# Patient Record
Sex: Female | Born: 2000 | Race: Black or African American | Hispanic: No | Marital: Single | Smoking: Never smoker
Health system: Southern US, Community
[De-identification: ages and names within clinical notes are randomized; demographics above are authoritative.]

## PROBLEM LIST (undated history)

## (undated) DIAGNOSIS — R569 Unspecified convulsions: Secondary | ICD-10-CM

---
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
## 2020-01-15 ENCOUNTER — Other Ambulatory Visit: Payer: Self-pay

## 2020-01-15 ENCOUNTER — Encounter (INDEPENDENT_AMBULATORY_CARE_PROVIDER_SITE_OTHER): Payer: Self-pay | Admitting: Pediatrics

## 2020-01-15 ENCOUNTER — Ambulatory Visit (INDEPENDENT_AMBULATORY_CARE_PROVIDER_SITE_OTHER): Payer: Managed Care, Other (non HMO) | Admitting: Pediatrics

## 2020-01-15 VITALS — BP 116/68 | HR 68 | Ht 61.0 in | Wt 175.4 lb

## 2020-01-15 DIAGNOSIS — G40209 Localization-related (focal) (partial) symptomatic epilepsy and epileptic syndromes with complex partial seizures, not intractable, without status epilepticus: Secondary | ICD-10-CM | POA: Diagnosis not present

## 2020-01-15 DIAGNOSIS — F445 Conversion disorder with seizures or convulsions: Secondary | ICD-10-CM | POA: Diagnosis not present

## 2020-01-15 NOTE — Patient Instructions (Signed)
I am pleased that you are doing well.  I would like to see you in 6 months.  You should have another medication to make that next appointment.  I would like to see you when you are home for Christmas break.  We talked about my decision to keep you on antiepileptic medication.  I think it is the right one to do.  Please let me know if I can be of help between now and when you are scheduled to be seen again.

## 2020-01-15 NOTE — Progress Notes (Signed)
Patient: Christina Robinson MRN: [id number] Sex: female DOB: [birth year]  Provider: Ellison Carwin, MD Location of Care: [facility]  Note type: Routine return visit  History of Present Illness: Referral Source: Christina Moore MD History from: patient and [facility] chart Chief Complaint: Seizures  Christina Robinson is a 19 y.o. female who was evaluated January 15, 2020 for the first time since July 17, 2019.  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  Her past history is recorded my last office note.  Her last known event was May 16, 2018.  She has been taken off oxcarbazepine on several occasions and every time it was stopped, she has recurrent events.  Even though I believe that it is serving the role of placebo, the implications of having events would severely curtail her dependence including driving.  The benefit of taking her off the medication that is not necessarily preventing these events is thoroughly outweighed by the clinical reality of that each time this has been done, she has an event that has led to restarting the medication.  She contracted Covid at school in September 2020 with a high fever and swelling of her throat.  This was not diagnosed until her third PCR test.  She was in quarantine for 2 weeks and then returned to work.  It is not clear whether she contracted this in her living group or at work.  She was vaccinated earlier this year and it went well.  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  She is not taking any summer courses.  She works 3 days a week at [employer] and lives at home.  She will return to school in person in mid-August.  She did not enjoy virtual classes.  Review of Systems: A complete review of systems was remarkable for patient is here to be seen  for sizures. Patient reports that she has not had any seizures since her last visit. She states that she has been doing well. She states that she has no concerns at this time., all other systems reviewed and negative.  Past Medical History Diagnosis Date  . Seizures (HCC)    Hospitalizations: No., Head Injury: No., Nervous System Infections: No., Immunizations up to date: Yes.    Copied from prior chart See November 17, 2017 note for details concerning her seizure-like behavior.  Seizure activity began at 19 years of age. The patient had normal development up until that time. Seizures were associated with tingling sensation in the right arm lasting 5 to 10 seconds, followed by tightening of the right fist, flexion of the wrist and extension of the arm, her head turning to the right, neck extending, and eyes deviated to the right and then rolling to the other side. EEG showed central spikes. She had Todd paresis. The patient had a cluster of seizures, November 23, 2011, and was transferred from [facility] to [place].   EEG monitoring showed no ictal activity during clinical behaviors that had been termed seizures which involved shaking of her lower extremities, shaking of her upper extremities without eye deviation, and presumed loss of consciousness.  A video made of these behaviors at home by her parents show fidgety, non-specific body movements, with her eyes shut that usually would begin when her parents would try to awaken her.  Dr. Theora Robinson  at [facility] a 42 minute EEG in the awake state and sleep that was entirely normal on May 20, 2014.  She has been taken on and off Trileptal based on perception that episodes are non-epileptic and then placed back on medication often through emergency department interactions. See August 16, 2017 note for further details.  Birth History 6lbs. 9oz. infant born at [redacted]weeks gestational age to a 19year old g  1p 29female. Gestation wasuncomplicated Mother receivedno Medication Normalspontaneous vaginal delivery Nursery Course wasuncomplicated Growth and Development wasrecalled asnormal  Behavior History psychogenic nonepileptic seizures  Surgical History History reviewed. No pertinent surgical history.  Family History family history is not on file. Family history is negative for migraines, seizures, intellectual disabilities, blindness, deafness, birth defects, chromosomal disorder, or autism.  Social History Socioeconomic History  . Marital status: Single  . Years of education:  29  . Highest education level:  Freshman year in college  Occupational History  .  Employed in [place] at [employer] and [employer]  Tobacco Use  . Smoking status: Passive Smoke Exposure - Never Smoker  . Smokeless tobacco: Never Used  Substance and Sexual Activity  . Alcohol use: No  . Drug use: Not on file  . 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    She attended [employer].    She lives with both parents. She has two sisters.    She enjoys music, Softball, and being in her room.    She is a Electrical engineer at [employer].   No Known Allergies  Physical Exam BP 116/68   Pulse 68   Ht 5\' 1"  (1.549 m)   Wt 175 lb 6.4 oz (79.6 kg)   BMI 33.14 kg/m   General: alert, well developed, obese, in no acute distress, black hair, brown eyes, right handed Head: normocephalic, no dysmorphic features Ears, Nose and Throat: Otoscopic: tympanic membranes normal; pharynx: oropharynx is pink without exudates or tonsillar hypertrophy Neck: supple, full range of motion, no cranial or cervical bruits Respiratory: auscultation clear Cardiovascular: no murmurs, pulses are normal Musculoskeletal: no skeletal deformities or apparent scoliosis Skin: no rashes or neurocutaneous lesions  Neurologic Exam  Mental Status:  alert; oriented to person, place and year; knowledge is normal for age; language is normal Cranial Nerves: visual fields are full to double simultaneous stimuli; extraocular movements are full and conjugate; pupils are round reactive to light; funduscopic examination shows sharp disc margins with normal vessels; symmetric facial strength; midline tongue and uvula; air conduction is greater than bone conduction bilaterally Motor: Normal strength, tone and mass; good fine motor movements; no pronator drift Sensory: intact responses to cold, vibration, proprioception and stereognosis Coordination: good finger-to-nose, rapid repetitive alternating movements and finger apposition Gait and Station: normal gait and station: patient is able to walk on heels, toes and tandem without difficulty; balance is adequate; Romberg exam is negative; Gower response is negative Reflexes: symmetric and diminished bilaterally; no clonus; bilateral flexor plantar responses  Assessment 1.  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 2.  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  Discussion I am pleased the Peter is doing well physically, neurologically, and academically.  Plan I am not going to change her oxcarbazepine.  I am pleased that she is taking contraceptive medication.  My biggest concern is pregnancy.  She can otherwise be on this medication for the rest of her life if she needs  it.  I do not see any reason to try to take her off of oxcarbazepine until such time as she does not have to drive to get to and from work.  She will return to see me in 6 months time.  I wrote a prescription 6 months ago for a 1 year supply of oxcarbazepine.  Greater than 50% of the 25-minute visit was spent in counseling and coordination of care concerning her nonepileptic seizures, the reason to continue her oxcarbazepine, and the problems that that will present when and if she wants to get  pregnant.  Hopefully that is a long way off.   Medication List   Accurate as of January 15, 2020 11:15 AM. If you have any questions, ask your nurse or doctor.    medroxyPROGESTERone 150 MG/ML injection Commonly known as: DEPO-PROVERA Inject 1 mL into the muscle every 3 (three) months.   oxcarbazepine 600 MG tablet Commonly known as: TRILEPTAL TAKE 2 TABLETS BY MOUTH  TWICE A DAY    The medication list was reviewed and reconciled. All changes or newly prescribed medications were explained.  A complete medication list was provided to the patient/caregiver.  Deetta Perla MD

## 2020-06-28 ENCOUNTER — Other Ambulatory Visit (INDEPENDENT_AMBULATORY_CARE_PROVIDER_SITE_OTHER): Payer: Self-pay | Admitting: Pediatrics

## 2020-06-28 DIAGNOSIS — G40209 Localization-related (focal) (partial) symptomatic epilepsy and epileptic syndromes with complex partial seizures, not intractable, without status epilepticus: Secondary | ICD-10-CM

## 2020-08-06 ENCOUNTER — Ambulatory Visit (INDEPENDENT_AMBULATORY_CARE_PROVIDER_SITE_OTHER): Payer: Managed Care, Other (non HMO) | Admitting: Pediatrics

## 2020-08-07 ENCOUNTER — Encounter (INDEPENDENT_AMBULATORY_CARE_PROVIDER_SITE_OTHER): Payer: Self-pay | Admitting: Pediatrics

## 2020-08-07 ENCOUNTER — Ambulatory Visit (INDEPENDENT_AMBULATORY_CARE_PROVIDER_SITE_OTHER): Payer: Managed Care, Other (non HMO) | Admitting: Pediatrics

## 2020-08-07 ENCOUNTER — Other Ambulatory Visit: Payer: Self-pay

## 2020-08-07 VITALS — BP 122/78 | HR 76 | Ht 61.0 in | Wt 178.0 lb

## 2020-08-07 DIAGNOSIS — G40209 Localization-related (focal) (partial) symptomatic epilepsy and epileptic syndromes with complex partial seizures, not intractable, without status epilepticus: Secondary | ICD-10-CM | POA: Diagnosis not present

## 2020-08-07 DIAGNOSIS — F445 Conversion disorder with seizures or convulsions: Secondary | ICD-10-CM | POA: Diagnosis not present

## 2020-08-07 MED ORDER — OXCARBAZEPINE 600 MG PO TABS: ORAL_TABLET | ORAL | 3 refills | Status: DC

## 2020-08-07 NOTE — Progress Notes (Signed)
Patient: Christina Robinson MRN: [id number] Sex: female DOB: August 17, 2000  Provider: Ellison Carwin, MD Location of Care: [facility]  Note type: Routine return visit  History of Present Illness: Referral Source: Tad Moore, MD History from: patient and [facility] chart Chief Complaint: Seizures  Christina Robinson is a 19 y.o. female who was evaluated August 07, 2020 for the first time since January 15, 2020.  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  Her past history is recorded in the July 17, 2019 note.  Her last known event was May 16, 2018.  Every time she has been taken off of oxcarbazepine she has had recurrent event.  She does not want to come off the medication.  She is a Holiday representative at [employer].  She hopes to work for the [employer].  She contracted Covid in September 2020 with high fever and swelling of her throat.  She has not had long-term symptoms.  She is fully vaccinated now.  She works part-time for [employer] at [employer] where she attends school and when she is at home in [place] she works at [employer].  Classes are going well.  She is getting adequate sleep.  She has gained about 2-1/2 pounds since her last visit.  Her mother feels her oxcarbazepine through [facility].  They asked for a 1 year supply which I will provide.  Review of Systems: A complete review of systems was remarkable for patient is here to be seen for seizures. She reports that she has been doing well since her last visit. She states that she has not had any seizures since her last visit. She has no concerns at this time., all other systems reviewed and negative.  Past Medical History Diagnosis Date  . Seizures (HCC)    Hospitalizations: No., Head Injury: No., Nervous System Infections: No., Immunizations up to date: Yes.    Copied from prior chart  notes See November 17, 2017 note for details concerning her seizure-like behavior.  Seizure activity began at 19 years of age. The patient had normal development up until that time. Seizures were associated with tingling sensation in the right arm lasting 5 to 10 seconds, followed by tightening of the right fist, flexion of the wrist and extension of the arm, her head turning to the right, neck extending, and eyes deviated to the right and then rolling to the other side. EEG showed central spikes. She had Todd paresis. The patient had a cluster of seizures, November 23, 2011, and was transferred from [facility] to [place].   EEG monitoring showed no ictal activity during clinical behaviors that had been termed seizures which involved shaking of her lower extremities, shaking of her upper extremities without eye deviation, and presumed loss of consciousness.  A video made of these behaviors at home by her parents show fidgety,non-specific body movements,with her eyes shut that usually would begin when her parents would try to awaken her.  Dr. Theora Master at [facility] a 42 minute EEG in the awake state and sleep that was entirely normal on May 20, 2014.  She has been taken on and off Trileptal based on perception that episodes are non-epileptic and then placed back on medication often through emergency department interactions. See August 16, 2017 note for further details.  Birth History 6lbs. 9oz. infant born at [redacted]weeks gestational age to a 19year old g 1p 55female. Gestation wasuncomplicated  Mother receivedno Medication Normalspontaneous vaginal delivery Nursery Course wasuncomplicated Growth and Development wasrecalled asnormal  Behavior History psychogenic nonepileptic seizures  Surgical History History reviewed. No pertinent surgical history.  Family History family history is not on file. Family history is negative for  migraines, seizures, intellectual disabilities, blindness, deafness, birth defects, chromosomal disorder, or autism.  Social History Socioeconomic History  . Marital status: Single  . Years of education:  36  . Highest education level:  Primary school teacher  Occupational History  .  [employer] part-time  Tobacco Use  . Smoking status: Passive Smoke Exposure - Never Smoker  . Smokeless tobacco: Never Used  Substance and Sexual Activity  . Alcohol use: No  . Drug use: Not on file  . 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    She attended [employer].    She lives with both parents. She has two sisters.    She enjoys music, Softball, and being in her room.    She will be a Holiday representative at [employer].   No Known Allergies  Physical Exam BP 122/78   Pulse 76   Ht 5\' 1"  (1.549 m)   Wt 178 lb (80.7 kg)   BMI 33.63 kg/m   General: alert, well developed, obese, in no acute distress, black hair, brown eyes, right handed Head: normocephalic, no dysmorphic features Ears, Nose and Throat: Otoscopic: tympanic membranes normal; pharynx: oropharynx is pink without exudates or tonsillar hypertrophy Neck: supple, full range of motion, no cranial or cervical bruits Respiratory: auscultation clear Cardiovascular: no murmurs, pulses are normal Musculoskeletal: no skeletal deformities or apparent scoliosis Skin: no rashes or neurocutaneous lesions  Neurologic Exam  Mental Status: alert; oriented to person, place and year; knowledge is normal for age; language is normal Cranial Nerves: visual fields are full to double simultaneous stimuli; extraocular movements are full and conjugate; pupils are round reactive to light; funduscopic examination shows sharp disc margins with normal vessels; symmetric facial strength; midline tongue and uvula; air conduction is greater than bone conduction bilaterally Motor: Normal strength,  tone and mass; good fine motor movements; no pronator drift Sensory: intact responses to cold, vibration, proprioception and stereognosis Coordination: good finger-to-nose, rapid repetitive alternating movements and finger apposition Gait and Station: normal gait and station: patient is able to walk on heels, toes and tandem without difficulty; balance is adequate; Romberg exam is negative; Gower response is negative Reflexes: symmetric and diminished bilaterally; no clonus; bilateral flexor plantar responses  Assessment 1.  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 2.  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  Discussion I spoke with at length about this.  She does not want to come off medication.  Will be to me times when she is had breakthrough seizures and she depends on her car to get to and from work.  Even if these episodes are all psychogenic, it could be problematic.  Plan I refilled the oxcarbazepine prescription for a year.  She will need to be seen by an adult neurologist after I retire in September 2022.  I want her to return to see me in 6 months' time and hope that I will have identified a provider for her..  50% of a 20-minute visit was spent in counseling and coordination of care concerning her history of seizures, nonepileptic seizures, treatment, and transition of care.   Medication List   Accurate as of August 07, 2020 12:04  PM. If you have any questions, ask your nurse or doctor.    medroxyPROGESTERone 150 MG/ML injection Commonly known as: DEPO-PROVERA Inject 1 mL into the muscle every 3 (three) months.   oxcarbazepine 600 MG tablet Commonly known as: TRILEPTAL TAKE 2 TABLETS BY MOUTH  TWICE DAILY    The medication list was reviewed and reconciled. All changes or newly prescribed medications were explained.  A complete medication list was provided to the patient/caregiver.  Deetta Perla MD

## 2020-08-07 NOTE — Patient Instructions (Signed)
Thank you for coming today. I am glad that you are doing well. I am glad that school is going well. I refilled your prescription for a year through your mail service. I would like to see you in 6 months so that we can arrange for long-term care with an adult neurologist.  In the interim please let me know if there is anything I can do to help you.

## 2020-12-12 ENCOUNTER — Encounter (INDEPENDENT_AMBULATORY_CARE_PROVIDER_SITE_OTHER): Payer: Self-pay

## 2021-01-29 ENCOUNTER — Telehealth (INDEPENDENT_AMBULATORY_CARE_PROVIDER_SITE_OTHER): Payer: Self-pay | Admitting: Pediatrics

## 2021-01-29 ENCOUNTER — Encounter: Payer: Self-pay | Admitting: Neurology

## 2021-01-29 DIAGNOSIS — G40209 Localization-related (focal) (partial) symptomatic epilepsy and epileptic syndromes with complex partial seizures, not intractable, without status epilepticus: Secondary | ICD-10-CM

## 2021-01-29 DIAGNOSIS — F445 Conversion disorder with seizures or convulsions: Secondary | ICD-10-CM

## 2021-01-29 NOTE — Telephone Encounter (Signed)
Requested transfer of care to Dr. Karen Aquino. 

## 2021-02-05 ENCOUNTER — Ambulatory Visit (INDEPENDENT_AMBULATORY_CARE_PROVIDER_SITE_OTHER): Payer: Managed Care, Other (non HMO) | Admitting: Pediatrics

## 2021-02-05 ENCOUNTER — Other Ambulatory Visit: Payer: Self-pay

## 2021-02-05 ENCOUNTER — Encounter (INDEPENDENT_AMBULATORY_CARE_PROVIDER_SITE_OTHER): Payer: Self-pay | Admitting: Pediatrics

## 2021-02-05 DIAGNOSIS — Z8669 Personal history of other diseases of the nervous system and sense organs: Secondary | ICD-10-CM | POA: Diagnosis not present

## 2021-02-05 NOTE — Progress Notes (Signed)
Patient: Christina Robinson MRN: [id number] Sex: female DOB: [birth year]  Provider: Ellison Carwin, MD Location of Care: [facility]  Note type: Routine return visit  History of Present Illness: Referral Source: Tad Moore, MD History from: mother, patient, and [facility] chart Chief Complaint: Seizures  SAVVY Robinson is a 20 y.o. female who was evaluated February 05, 2021 for the first time since August 07, 2020.  [name] has a past history of focal epilepsy with secondary generalization.  More recently her episodes have been psychogenic nonepileptic seizures.  Her last known event was May 16, 2018.  Every time she has been taken off oxcarbazepine she has experienced a recurrent event.  She does not want to come off medication.  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  She is also minoring in prelaw.  I contacted Dr. Clydie Braun Aquino's office and she is set up for an initial visit in April 04, 2021.  Her general health is good.  She has not experienced any significant illnesses or had any emergency room visits.  She contracted COVID in September 2020 since that time she has been vaccinated and boosted.  She goes to bed around 11:30 PM and gets up between 9 AM and 10 AM although this summer she is getting up at 8 AM.  She spending the summer in [place] working at [employer].  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  Review of Systems: A complete review of systems was remarkable for patient is here to be seen for seizures. Patient reports that she has not had any seizures since her last visit. She reports no concerns., all other systems reviewed and negative.  Past Medical History Diagnosis Date   Seizures (HCC)     Hospitalizations: No., Head Injury: No., Nervous System Infections: No., Immunizations up to date: Yes.    Copied from prior chart notes See November 17, 2017 note for details concerning her seizure-like behavior.   Seizure activity began at 20 years of age.  The patient had normal development up until that time.  Seizures were associated with tingling sensation in the right arm lasting 5 to 10 seconds, followed by tightening of the right fist, flexion of the wrist and extension of the arm, her head turning to the right, neck extending, and eyes deviated to the right and then rolling to the other side.  EEG showed central spikes.  She had Todd paresis.  The patient had a cluster of seizures, November 23, 2011, and was transferred from [facility] to [place].     EEG monitoring showed no ictal activity during clinical behaviors that had been termed seizures which involved shaking of her lower extremities, shaking of her upper extremities without eye deviation, and presumed loss of consciousness.   A video made of these behaviors at home by her parents show fidgety, non-specific body movements, with her eyes shut that usually would begin when her parents would try to awaken her.   Dr. Theora Master at [facility] made a 42 minute EEG in the awake state and sleep that was entirely normal on May 20, 2014.   She has been taken on and off Trileptal/oxcarbazepine based on perception that episodes are non-epileptic and then placed  back on medication often through emergency department interactions.  See August 16, 2017 note for further details.   Birth History 6 lbs. 9 oz. infant born at [redacted] weeks gestational age to a 20 year old g 1 p 0 female. Gestation was uncomplicated Mother received no Medication Normal spontaneous vaginal delivery Nursery Course was uncomplicated Growth and Development was recalled as normal   Behavior History psychogenic nonepileptic  seizures  Surgical History History reviewed. No pertinent surgical history.  Family History family history is not on file. Family history is negative for migraines, seizures, intellectual disabilities, blindness, deafness, birth defects, chromosomal disorder, or autism.  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   She attended [employer].   She lives with both parents. She has two sisters.   She enjoys music, Softball, and being in her room.   She is a Holiday representative at [employer].   No Known Allergies  Physical Exam BP 116/78   Pulse 64   Ht 5\' 1"  (1.549 m)   Wt 178 lb 6.4 oz (80.9 kg)   BMI 33.71 kg/m   General: alert, well developed, well nourished, in no acute distress, black hair, brown eyes, right handed Head: normocephalic, no dysmorphic features Ears, Nose and Throat: Otoscopic: tympanic membranes normal; pharynx: oropharynx is pink without exudates or tonsillar hypertrophy Neck: supple, full range of motion, no cranial or cervical bruits Respiratory: auscultation clear Cardiovascular: no murmurs, pulses are normal Musculoskeletal: no skeletal deformities or apparent scoliosis Skin: no rashes or neurocutaneous lesions  Neurologic Exam  Mental Status: alert; oriented to person, place and year; knowledge is normal for age; language is normal Cranial Nerves: visual fields are full to double simultaneous stimuli; extraocular movements are full and conjugate; pupils are round reactive to light; funduscopic examination shows sharp disc  margins with normal vessels; symmetric facial strength; midline tongue and uvula; air conduction is greater than bone conduction bilaterally Motor: Normal strength, tone and mass; good fine motor movements; no pronator drift Sensory: intact responses to cold, vibration, proprioception and stereognosis Coordination: good finger-to-nose, rapid repetitive alternating movements and finger apposition Gait and Station: normal gait and station: patient is able to walk on heels, toes and tandem without difficulty; balance is adequate; Romberg exam is negative; Gower response is negative Reflexes: symmetric and diminished bilaterally; no clonus; bilateral flexor plantar responses   Assessment 1.  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 2.  History of focal epilepsy with impairment of consciousness, Z86.69.  Discussion Despite the clear-cut evidence of nonepileptic seizures, the patient is frightened of coming off antiepileptic medication.  I explained to her the benefits of staying on medication in terms of her medical and neurologic stability.  I discussed the risks of stay on medication in terms of potential birth defects should she become pregnant while taking it.  Plan A prescription was refilled for oxcarbazepine.  She will see Dr. April 04, 2021.  This clearly is an unresolved issue but the patient's preference is to stay on medication.  Greater than 50% of a 30-minute visit was spent in counseling and coordination of care concerning her psychogenic nonepileptic seizures, benefits and side effects of oxcarbazepine, and discussion of transition of care.  Medication List    Accurate as of February 05, 2021 11:35 AM. If you have any questions, ask your nurse or doctor.     medroxyPROGESTERone 150 MG/ML injection Commonly known as: DEPO-PROVERA Inject 1 mL into the muscle every 3 (three) months.   oxcarbazepine 600 MG tablet Commonly known as: TRILEPTAL TAKE 2 TABLETS BY MOUTH  TWICE  DAILY     The medication list was reviewed and reconciled. All changes or newly prescribed medications were explained.  A complete medication list was provided to the patient/caregiver.  Deetta Perla MD

## 2021-02-05 NOTE — Patient Instructions (Addendum)
I have enjoyed providing care to you over the years.  I am very pleased that you have an appointment to see Dr. Karel Jarvis.  I think that you will like her.  I cannot make any promises about what decision she will make, but I know that she will listen to you as I have.  You have refills until December 2022.  I will be available until May 09, 2021 in case she have any problems.  I do not expect that.  I wish you the best in all that you do.

## 2021-02-06 DIAGNOSIS — Z8669 Personal history of other diseases of the nervous system and sense organs: Secondary | ICD-10-CM | POA: Insufficient documentation

## 2021-04-04 ENCOUNTER — Ambulatory Visit (INDEPENDENT_AMBULATORY_CARE_PROVIDER_SITE_OTHER): Payer: Managed Care, Other (non HMO) | Admitting: Neurology

## 2021-04-04 ENCOUNTER — Other Ambulatory Visit: Payer: Self-pay

## 2021-04-04 ENCOUNTER — Encounter: Payer: Self-pay | Admitting: Neurology

## 2021-04-04 VITALS — BP 110/61 | HR 81 | Ht 61.0 in | Wt 177.4 lb

## 2021-04-04 DIAGNOSIS — G40009 Localization-related (focal) (partial) idiopathic epilepsy and epileptic syndromes with seizures of localized onset, not intractable, without status epilepticus: Secondary | ICD-10-CM | POA: Diagnosis not present

## 2021-04-04 DIAGNOSIS — F445 Conversion disorder with seizures or convulsions: Secondary | ICD-10-CM | POA: Diagnosis not present

## 2021-04-04 MED ORDER — OXCARBAZEPINE 600 MG PO TABS: ORAL_TABLET | ORAL | 3 refills | Status: DC

## 2021-04-04 NOTE — Patient Instructions (Addendum)
Good to meet you!  We'll do safety bloodwork with CBC, CMP. Please establish care with an adult family physician  2. Continue Oxcarbazepine 600mg : Take 2 tablets twice a day  3. Recommend starting a daily folic acid 1mg  tablet  4. Follow-up in 1 year, call for any changes   Seizure Precautions: 1. If medication has been prescribed for you to prevent seizures, take it exactly as directed.  Do not stop taking the medicine without talking to your doctor first, even if you have not had a seizure in a long time.   2. Avoid activities in which a seizure would cause danger to yourself or to others.  Don't operate dangerous machinery, swim alone, or climb in high or dangerous places, such as on ladders, roofs, or girders.  Do not drive unless your doctor says you may.  3. If you have any warning that you may have a seizure, lay down in a safe place where you can't hurt yourself.    4.  No driving for 6 months from last seizure, as per [facility].   Please refer to the following link on the Epilepsy Foundation of America's website for more information: http://www.epilepsyfoundation.org/answerplace/Social/driving/drivingu.cfm   5.  Maintain good sleep hygiene. Avoid alcohol.  6.  Notify your neurology if you are planning pregnancy or if you become pregnant.  7.  Contact your doctor if you have any problems that may be related to the medicine you are taking.  8.  Call 911 and bring the patient back to the ED if:        A.  The seizure lasts longer than 5 minutes.       B.  The patient doesn't awaken shortly after the seizure  C.  The patient has new problems such as difficulty seeing, speaking or moving  D.  The patient was injured during the seizure  E.  The patient has a temperature over 102 F (39C)  F.  The patient vomited and now is having trouble breathing

## 2021-04-04 NOTE — Progress Notes (Signed)
NEUROLOGY CONSULTATION NOTE  Christina Robinson MRN: [id number] DOB: 2001-05-19  Referring provider: Dr. Ellison Robinson Primary care provider: Dr. Sharen Heck Robinson  Reason for consult:  establish adult epilepsy care  Dear Dr Christina Robinson:  Thank you for your kind referral of Christina Robinson for consultation of the above symptoms. Although her history is well known to you, please allow me to reiterate it for the purpose of our medical record. The patient was accompanied to the clinic by her mother Christina Robinson who also provides collateral information. Records and images were personally reviewed where available.   HISTORY OF PRESENT ILLNESS: This is a pleasant 20 year old right-handed woman with a history of focal epilepsy with secondary generalization presenting to establish adult epilepsy care. Records from her pediatric neurologist Dr. Sharene Robinson were reviewed, her last visit with him was 01/2021. 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 Seizures began at age 20 with tingling in the right arm lasting 5-10 seconds followed by tightening of the right fist, flexion of the wrist and extension of the arm, head turning to the right, neck extending and eyes deviated to the right. 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 At that time, her mother denied any loss of consciousness, she would be able to talk during the seizures. EEG showed central spikes. They recall taking Keppra at that time, then oxcarbazepine. Her mother reports that she was seizure-free for 2.5 years until she had a full body shaking episode at age 20 that was different from prior seizures. She was having frequent seizures until she was admitted to [facility] in 2013 and diagnosed with psychogenic non-epileptic events, she had multiple episodes of arrhythmic side to side shaking with pelvic thrusting with no EEG correlate, baseline EEG was normal. She was evaluated by neurologist Dr. Malvin Robinson in 2015 for  recurrent shaking episodes in school. EEG in 2015 was normal. She was started on Lamotrigine and referred to [facility]. She did not feel right on the Lamotrigine and went back to Dr. Sharene Robinson. Oxcarbazepine was restarted and they report that every time she would be weaned off oxcarbazepine, she would have a recurrent event. She states that she would have an aura of right arm numbness travelling up, she would feel shaking inside and see her body shaking briefly before she loses consciousness. No tongue bite or incontinence, no injuries. Her right side is always weaker after and she feels "super tired." Her mother reports they last less than a minute but cannot walk for 5 minutes later. Her last seizure was in 07/2019 when her parents were separating. This is is current longest seizure-free period (2 years). She denies any side effects on oxcarbazepine 600mg  2 tabs BID. She denies any olfactory/gustatory hallucinations, rising epigastric sensation, headaches, dizziness, diplopia, dysarthria/dysphagia, neck/back pain, bowel/bladder dysfunction. She is currently a in college, school is good. Memory is okay. She is driving. She is on Depo-Provera for birth control. Sleep overall okay with 7 hours of sleep. Mood is good.   Epilepsy Risk Factors:  She had a normal birth and early development.  There is no history of febrile convulsions, CNS infections such as meningitis/encephalitis, significant traumatic brain injury, neurosurgical procedures, or family history of seizures.  Prior AEDs:Lamotrigine (did not feel right), Keppra, clonazepam  Prior EEGs: 2013 EMU normal baseline EEG with several episodes of arrhythmic side to side shaking, pelvic thrusting with no EEG changes 2015 EEG at [facility] normal.  MRI brain in 08/2007 at [facility] was  normal.   PAST MEDICAL HISTORY: Past Medical History:  Diagnosis Date   Seizures (HCC)     PAST SURGICAL HISTORY: No past surgical history on  file.  MEDICATIONS: Current Outpatient Medications on File Prior to Visit  Medication Sig Dispense Refill   medroxyPROGESTERone (DEPO-PROVERA) 150 MG/ML injection Inject 1 mL into the muscle every 3 (three) months.     oxcarbazepine (TRILEPTAL) 600 MG tablet TAKE 2 TABLETS BY MOUTH  TWICE DAILY 360 tablet 3   No current facility-administered medications on file prior to visit.    ALLERGIES: No Known Allergies  FAMILY HISTORY: No family history on file.  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   She attended [employer].   She lives with both parents. She has two sisters.   She enjoys music, Softball, and being in her room.   She is a Holiday representative at [employer].   Right handed   Social Determinants of Health   Financial Resource Strain: Not on file  Food Insecurity: Not on file  Transportation Needs: Not on file  Physical Activity: Not on file  Stress: Not on file  Social Connections: Not on file  Intimate Partner Violence: Not on file     PHYSICAL EXAM: Vitals:   04/04/21 0910  Pulse: 81  SpO2: 96%   General: No acute distress Head:  Normocephalic/atraumatic Skin/Extremities: No rash, no edema Neurological Exam: Mental status: alert and oriented to person, place, and time, no dysarthria or aphasia, Fund of knowledge is appropriate.  Recent and remote memory are intact, 3/3 delayed recal.  Attention and concentration are normal, 5/5 WORLD  backward. Cranial nerves: CN I: not tested CN II: pupils equal, round and reactive to light, visual fields intact CN III, IV, VI:  full range of motion, no nystagmus, no ptosis CN V: facial sensation intact CN VII: upper and lower face symmetric CN VIII: hearing intact to conversation Bulk & Tone: normal, no fasciculations. Motor: 5/5 throughout with no pronator drift. Sensation: intact to light touch, cold, pin, vibration and joint position sense.  No extinction to double simultaneous stimulation.  Romberg test negative Deep Tendon Reflexes: +2 throughout Cerebellar: no incoordination on finger to nose testing Gait: narrow-based and steady, able to tandem walk adequately. 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 Focal seizures started at age 37 with right-sided numbness and clonic activity, she had an increase in seizures in 2013 and EMU monitoring at [facility] had captured shaking episodes with no EEG correlate consistent with psychogenic non-epileptic events. MRI brain in 2009 normal. She reports that seizures still start with right-sided symptoms followed by right-sided weakness, last seizure was in 07/2019 in the setting of increased family stress. She is doing well on oxcarbazepine 600mg  2 tabs  BID. Prior attempts to wean in the past would lead to more seizures. We agreed to continue on current regimen. We discussed issues in women with epilepsy, no current pregnancy plans, she is on Depo-Provera, discussed starting daily folic acid if she is sexually active. We discussed the avoidance of seizure triggers, including missing medication, sleep deprivation, and alcohol.  [place] driving laws were discussed with the patient, and she knows to stop driving after a seizure, until 6 months seizure-free. She has not seen a PCP in a long time, safety labs with CBC, CMP will be ordered today,  encouraged to establish care with adult PCP. Follow-up in 1 year, they know to call for any changes.   Thank you for allowing me to participate in the care of this patient. Please do not hesitate to call for any questions or concerns.   Patrcia Dolly, M.D.  CC: Dr. Cherie Ouch, Dr. Sharene Robinson

## 2022-01-13 ENCOUNTER — Telehealth: Payer: Self-pay | Admitting: Neurology

## 2022-01-13 NOTE — Telephone Encounter (Signed)
Pt called in and left a message. She stated she is trying to get a scholarship for people with seizures and the application process would require her to have a letter or note stating she does have seizures.

## 2022-02-06 ENCOUNTER — Encounter: Payer: Self-pay | Admitting: Neurology

## 2022-02-06 NOTE — Telephone Encounter (Signed)
Pt called no answer no voice mail  

## 2022-02-06 NOTE — Telephone Encounter (Signed)
Done, pls let her know ready for pickup or mailing. thanks

## 2022-02-09 NOTE — Telephone Encounter (Signed)
Call no answer, voicemail unavailabe at 9:43 02/09/2022

## 2022-04-06 ENCOUNTER — Encounter: Payer: Self-pay | Admitting: Neurology

## 2022-04-06 ENCOUNTER — Ambulatory Visit: Payer: Managed Care, Other (non HMO) | Admitting: Neurology

## 2022-04-06 VITALS — BP 120/87 | HR 85 | Resp 18 | Ht 61.0 in | Wt 187.0 lb

## 2022-04-06 DIAGNOSIS — G40009 Localization-related (focal) (partial) idiopathic epilepsy and epileptic syndromes with seizures of localized onset, not intractable, without status epilepticus: Secondary | ICD-10-CM

## 2022-04-06 DIAGNOSIS — F445 Conversion disorder with seizures or convulsions: Secondary | ICD-10-CM

## 2022-04-06 MED ORDER — OXCARBAZEPINE 600 MG PO TABS: ORAL_TABLET | ORAL | 3 refills | Status: DC

## 2022-04-06 NOTE — Progress Notes (Signed)
NEUROLOGY FOLLOW UP OFFICE NOTE  AME HEAGLE [id number] [birth year]  HISTORY OF PRESENT ILLNESS: I had the pleasure of seeing Christina Robinson in follow-up in the neurology clinic on 04/06/2022.  The patient was last seen a year ago for seizures. She has a history of focal epilepsy with secondary generalization, as well as psychogenic non-epileptic events of full body shaking captured on EMU at [facility] in 2013. She is on oxcarbazepine 600mg  2 tabs BID without side effects. Since her last visit, she reports doing well with no seizures since 07/2019. She denies any staring/unresponsive episodes, gaps in time, olfactory/gustatory hallucinations, focal numbness/tingling/weakness, myoclonic jerks. No headaches, dizziness, vision changes, no falls. She usually gets 6 hours of sleep. Mood is good. No pregnancy plans, she is on Depo-Provera.   History on Initial Assessment 04/04/2021: This is a pleasant 21 year old right-handed woman with a history of focal epilepsy with secondary generalization presenting to establish adult epilepsy care. Records from her pediatric neurologist Dr. 26 were reviewed, her last visit with him was 01/2021. 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 Seizures began at age 21 with tingling in the right arm lasting 5-10 seconds followed by tightening of the right fist, flexion of the wrist and extension of the arm, head turning to the right, neck extending and eyes deviated to the right. 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 At that time, her mother denied any loss of consciousness, she would be able to talk during the seizures. EEG showed central spikes. They recall taking Keppra at that time, then oxcarbazepine. Her mother reports that she was seizure-free for 2.5 years until she had a full body shaking episode at age 21 that was different from prior seizures. She was having frequent seizures until she was admitted to [facility] in 2013 and  diagnosed with psychogenic non-epileptic events, she had multiple episodes of arrhythmic side to side shaking with pelvic thrusting with no EEG correlate, baseline EEG was normal. She was evaluated by neurologist Dr. 2014 in 2015 for recurrent shaking episodes in school. EEG in 2015 was normal. She was started on Lamotrigine and referred to [facility]. She did not feel right on the Lamotrigine and went back to Dr. [facility]. Oxcarbazepine was restarted and they report that every time she would be weaned off oxcarbazepine, she would have a recurrent event. She states that she would have an aura of right arm numbness travelling up, she would feel shaking inside and see her body shaking briefly before she loses consciousness. No tongue bite or incontinence, no injuries. Her right side is always weaker after and she feels "super tired." Her mother reports they last less than a minute but cannot walk for 5 minutes later. Her last seizure was in 07/2019 when her parents were separating. This is her current longest seizure-free period (2 years). She denies any side effects on oxcarbazepine 600mg  2 tabs BID. She denies any olfactory/gustatory hallucinations, rising epigastric sensation, headaches, dizziness, diplopia, dysarthria/dysphagia, neck/back pain, bowel/bladder dysfunction. She is currently a 08/2019 in college, school is good. Memory is okay. She is driving. She is on Depo-Provera for birth control. Sleep overall okay with 7 hours of sleep. Mood is good.   Epilepsy Risk Factors:  She had a normal birth and early development.  There is no history of febrile convulsions, CNS infections such as meningitis/encephalitis, significant traumatic brain injury, neurosurgical procedures, or family history of seizures.  Prior AEDs:Lamotrigine (did not feel right), Keppra, clonazepam  Prior  EEGs: 2013 EMU normal baseline EEG with several episodes of arrhythmic side to side shaking, pelvic thrusting with no EEG  changes 2015 EEG at [facility] normal.  MRI brain in 08/2007 at [facility] was normal.   PAST MEDICAL HISTORY: Past Medical History:  Diagnosis Date   Seizures (HCC)     MEDICATIONS: Current Outpatient Medications on File Prior to Visit  Medication Sig Dispense Refill   medroxyPROGESTERone (DEPO-PROVERA) 150 MG/ML injection Inject 1 mL into the muscle every 3 (three) months.     oxcarbazepine (TRILEPTAL) 600 MG tablet TAKE 2 TABLETS BY MOUTH  TWICE DAILY 360 tablet 3   No current facility-administered medications on file prior to visit.    ALLERGIES: No Known Allergies  FAMILY HISTORY: History reviewed. No pertinent family history.  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   She attended [employer].   She lives with both parents. She has two sisters.   She enjoys music, Softball, and being in her room.   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 No  aphasia or dysarthria. Fund of knowledge is appropriate.  Attention and concentration are normal.   Cranial nerves: Pupils equal, round. Extraocular movements intact with no nystagmus. Visual fields full.  No facial asymmetry.  Motor: Bulk and tone normal, muscle strength 5/5 throughout with no pronator drift.   Finger to nose testing intact.  Gait narrow-based and steady, able to tandem walk adequately.  Romberg negative.   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 Focal seizures started at age 84 with right-sided numbness and clonic activity, she had an increase in seizures in 2013 and EMU monitoring at [facility] had captured shaking episodes with no EEG correlate consistent with psychogenic non-epileptic events. MRI brain in 2009 normal. She denies any seizures since 07/2019 on oxcarbazepine 600mg  2 tabs BID (1200mg  BID), refills sent. We discussed avoidance of seizure triggers, including missing medication, sleep deprivation, and alcohol. No pregnancy plans. She is aware of [place] driving laws to stop driving after a seizure until 6 months seizure-free. Follow-up in 1 year, call for any changes.    Thank you for allowing me to participate in her care.  Please do not hesitate to call for any  questions or concerns.    Patrcia Dolly, M.D.   CC: Dr. Cherie Ouch

## 2022-04-06 NOTE — Patient Instructions (Signed)
Good to see you. Continue oxcarbazepine 600mg : take 2 tablets twice a day. Follow-up in 1 year, call for any changes.    Seizure Precautions: 1. If medication has been prescribed for you to prevent seizures, take it exactly as directed.  Do not stop taking the medicine without talking to your doctor first, even if you have not had a seizure in a long time.   2. Avoid activities in which a seizure would cause danger to yourself or to others.  Don't operate dangerous machinery, swim alone, or climb in high or dangerous places, such as on ladders, roofs, or girders.  Do not drive unless your doctor says you may.  3. If you have any warning that you may have a seizure, lay down in a safe place where you can't hurt yourself.    4.  No driving for 6 months from last seizure, as per [facility].   Please refer to the following link on the Epilepsy Foundation of America's website for more information: http://www.epilepsyfoundation.org/answerplace/Social/driving/drivingu.cfm   5.  Maintain good sleep hygiene. Avoid alcohol.  6.  Notify your neurology if you are planning pregnancy or if you become pregnant.  7.  Contact your doctor if you have any problems that may be related to the medicine you are taking.  8.  Call 911 and bring the patient back to the ED if:        A.  The seizure lasts longer than 5 minutes.       B.  The patient doesn't awaken shortly after the seizure  C.  The patient has new problems such as difficulty seeing, speaking or moving  D.  The patient was injured during the seizure  E.  The patient has a temperature over 102 F (39C)  F.  The patient vomited and now is having trouble breathing

## 2022-08-26 ENCOUNTER — Encounter: Payer: Self-pay | Admitting: Neurology

## 2022-12-18 DIAGNOSIS — Z0279 Encounter for issue of other medical certificate: Secondary | ICD-10-CM

## 2023-03-08 ENCOUNTER — Ambulatory Visit: Payer: Managed Care, Other (non HMO) | Admitting: Neurology

## 2023-03-24 ENCOUNTER — Ambulatory Visit: Payer: Self-pay | Admitting: Neurology

## 2023-04-06 ENCOUNTER — Ambulatory Visit: Payer: BLUE CROSS/BLUE SHIELD | Admitting: Neurology

## 2023-04-06 ENCOUNTER — Encounter: Payer: Self-pay | Admitting: Neurology

## 2023-04-06 VITALS — BP 111/69 | HR 78 | Ht 61.0 in | Wt 198.2 lb

## 2023-04-06 DIAGNOSIS — G40009 Localization-related (focal) (partial) idiopathic epilepsy and epileptic syndromes with seizures of localized onset, not intractable, without status epilepticus: Secondary | ICD-10-CM

## 2023-04-06 DIAGNOSIS — F445 Conversion disorder with seizures or convulsions: Secondary | ICD-10-CM

## 2023-04-06 MED ORDER — OXCARBAZEPINE 600 MG PO TABS: ORAL_TABLET | ORAL | 3 refills | Status: DC

## 2023-04-06 NOTE — Patient Instructions (Signed)
Good to see you doing well. Continue oxcarbazepine 600mg : Take 2 tablets twice a day. Follow-up in 1 year, call for any changes.    Seizure Precautions: 1. If medication has been prescribed for you to prevent seizures, take it exactly as directed.  Do not stop taking the medicine without talking to your doctor first, even if you have not had a seizure in a long time.   2. Avoid activities in which a seizure would cause danger to yourself or to others.  Don't operate dangerous machinery, swim alone, or climb in high or dangerous places, such as on ladders, roofs, or girders.  Do not drive unless your doctor says you may.  3. If you have any warning that you may have a seizure, lay down in a safe place where you can't hurt yourself.    4.  No driving for 6 months from last seizure, as per [facility].   Please refer to the following link on the Epilepsy Foundation of [employer]'s website for more information: http://www.epilepsyfoundation.org/answerplace/Social/driving/drivingu.cfm   5.  Maintain good sleep hygiene. Avoid alcohol.  6.  Notify your neurology if you are planning pregnancy or if you become pregnant.  7.  Contact your doctor if you have any problems that may be related to the medicine you are taking.  8.  Call 911 and bring the patient back to the ED if:        A.  The seizure lasts longer than 5 minutes.       B.  The patient doesn't awaken shortly after the seizure  C.  The patient has new problems such as difficulty seeing, speaking or moving  D.  The patient was injured during the seizure  E.  The patient has a temperature over 102 F (39C)  F.  The patient vomited and now is having trouble breathing

## 2023-04-06 NOTE — Progress Notes (Signed)
NEUROLOGY FOLLOW UP OFFICE NOTE  Christina Robinson [id number] May 04, 2001  HISTORY OF PRESENT ILLNESS: I had the pleasure of seeing Christina Robinson in follow-up in the neurology clinic on 04/06/2023. She is alone in the office today. The patient was last seen a year ago for seizures. 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 She has been seizure-free since 07/2019 on oxcarbazepine 1200mg  BID (600mg  2 tabs BID) without side effects. She denies any staring/unresponsive episodes, gaps in time, olfactory/gustatory hallucinations, focal numbness/tingling/weakness, myoclonic jerks. No headaches, dizziness, vision changes, no falls. She gets 6-7 hours of sleep. Mood is pretty even. She lives with her mother and sister. She works in a Lobbyist and drives. She is off the Depo-provera, no pregnancy plans.    History on Initial Assessment 04/04/2021: This is a pleasant 22 year old right-handed woman with a history of focal epilepsy with secondary generalization presenting to establish adult epilepsy care. Records from her pediatric neurologist Dr. Sharene Skeans were reviewed, her last visit with him was 01/2021. 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 Seizures began at age 41 with tingling in the right arm lasting 5-10 seconds followed by tightening of the right fist, flexion of the wrist and extension of the arm, head turning to the right, neck extending and eyes deviated to the right. 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 At that time, her mother denied any loss of consciousness, she would be able to talk during the seizures. EEG showed central spikes. They recall taking Keppra at that time, then oxcarbazepine. Her mother reports that she was seizure-free for 2.5 years until she had a full body shaking episode at age 22 that was different from prior  seizures. She was having frequent seizures until she was admitted to [facility] in 2013 and diagnosed with psychogenic non-epileptic events, she had multiple episodes of arrhythmic side to side shaking with pelvic thrusting with no EEG correlate, baseline EEG was normal. She was evaluated by neurologist Dr. Malvin Johns in 2015 for recurrent shaking episodes in school. EEG in 2015 was normal. She was started on Lamotrigine and referred to [facility]. She did not feel right on the Lamotrigine and went back to Dr. Sharene Skeans. Oxcarbazepine was restarted and they report that every time she would be weaned off oxcarbazepine, she would have a recurrent event. She states that she would have an aura of right arm numbness travelling up, she would feel shaking inside and see her body shaking briefly before she loses consciousness. No tongue bite or incontinence, no injuries. Her right side is always weaker after and she feels "super tired." Her mother reports they last less than a minute but cannot walk for 5 minutes later. Her last seizure was in 07/2019 when her parents were separating. This is her current longest seizure-free period (22 years). She denies any side effects on oxcarbazepine 600mg  2 tabs BID. She denies any olfactory/gustatory hallucinations, rising epigastric sensation, headaches, dizziness, diplopia, dysarthria/dysphagia, neck/back pain, bowel/bladder dysfunction. 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 Memory is okay. She is driving. She is on Depo-Provera for birth control. Sleep overall okay with 7 hours of sleep. Mood is good.   Epilepsy Risk Factors:  She had a normal birth and early development.  There is no history of febrile convulsions, CNS infections such as meningitis/encephalitis, significant traumatic brain injury, neurosurgical procedures,  or family history of seizures.  Prior AEDs:Lamotrigine (did not feel right), Keppra, clonazepam  Prior EEGs: 2013 EMU normal baseline EEG  with several episodes of arrhythmic side to side shaking, pelvic thrusting with no EEG changes 2015 EEG at [facility] normal.  MRI brain in 08/2007 at [facility] was normal.   PAST MEDICAL HISTORY: Past Medical History:  Diagnosis Date   Seizures (HCC)     MEDICATIONS: Current Outpatient Medications on File Prior to Visit  Medication Sig Dispense Refill   oxcarbazepine (TRILEPTAL) 600 MG tablet TAKE 2 TABLETS BY MOUTH  TWICE DAILY 360 tablet 3   No current facility-administered medications on file prior to visit.    ALLERGIES: Allergies  Allergen Reactions   Mucinex [Guaifenesin Er]     FAMILY HISTORY: History reviewed. No pertinent family history.  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   She attended [employer].   She lives with both parents. She has two sisters.   She enjoys music, Softball, and being in her room.   She is a Holiday representative at [employer].   Right handed   Social Determinants of Health   Financial Resource Strain: Not on file  Food Insecurity: Not on file  Transportation Needs: Not on file  Physical Activity: Not on file  Stress: Not on file  Social Connections: Not on file  Intimate Partner Violence: Not on file     PHYSICAL EXAM: Vitals:   04/06/23 1417  BP: 111/69  Pulse: 78  SpO2: 98%   General: No acute distress Head:  Normocephalic/atraumatic Skin/Extremities: No rash, no  edema Neurological Exam: alert and awake. No aphasia or dysarthria. Fund of knowledge is appropriate.  Attention and concentration are normal.   Cranial nerves: Pupils equal, round. Extraocular movements intact with no nystagmus. Visual fields full.  No facial asymmetry.  Motor: Bulk and tone normal, muscle strength 5/5 throughout with no pronator drift.   Finger to nose testing intact.  Gait narrow-based and steady, able to tandem walk adequately.  Romberg negative.   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 Focal seizures started at age 67 with right-sided numbness and clonic activity, she had an increase in seizures in 2013 and EMU monitoring at [facility] had captured shaking episodes with no EEG correlate consistent with psychogenic non-epileptic events. MRI brain in 2009 normal. She continues to do well seizure-free since 07/2019 on Oxcarbazepine 1200mg  BID, refills sent. We discussed issues in women with epilepsy, no pregnancy plans. She is aware of [place] driving laws to stop driving after a seizure until 6 months seizure-free. Follow-up in 1 year, call for any changes.    Thank you for allowing me to participate in her care.  Please do not hesitate to call  for any questions or concerns.    Patrcia Dolly, M.D.   CC: Dr. Della Goo Nogo

## 2023-10-29 ENCOUNTER — Encounter: Payer: Self-pay | Admitting: Neurology

## 2023-10-29 ENCOUNTER — Ambulatory Visit: Payer: BLUE CROSS/BLUE SHIELD | Admitting: Neurology

## 2023-10-29 VITALS — BP 113/72 | HR 88 | Ht 61.0 in | Wt 207.4 lb

## 2023-10-29 DIAGNOSIS — R400 Somnolence: Secondary | ICD-10-CM

## 2023-10-29 DIAGNOSIS — G40009 Localization-related (focal) (partial) idiopathic epilepsy and epileptic syndromes with seizures of localized onset, not intractable, without status epilepticus: Secondary | ICD-10-CM

## 2023-10-29 MED ORDER — OXCARBAZEPINE 600 MG PO TABS: ORAL_TABLET | ORAL | 3 refills | Status: AC

## 2023-10-29 NOTE — Progress Notes (Signed)
 NEUROLOGY FOLLOW UP OFFICE NOTE  Christina Robinson [id number] [birth year]  HISTORY OF PRESENT ILLNESS: I had the pleasure of seeing Christina Robinson in follow-up in the neurology clinic on 10/29/2023.  The patient was last seen 7 months ago for seizures. She has a history of focal epilepsy with secondary generalization, as well as functional seizures with full body shaking captured on EMU at [facility] in 2013. She is happy to report she has been seizure-free since 07/2019 on Oxcarbazepine 1200mg  BID (600mg  2 tabs BID) without side effects. She denies any staring/unresponsive episodes, gaps in time, olfactory/gustatory hallucinations, focal numbness/tingling/weakness, myoclonic jerks. No headaches, dizziness, vision changes, no falls. Sleep is not good, she gets 6 hours of sleep and has daytime drowsiness. She has been told she snores. She lives with her mother and sister. She is finishing her Masters degree in Criminal Justice next May. No pregnancy plans. Mood is fine.    History on Initial Assessment 04/04/2021: This is a pleasant 23 year old right-handed woman with a history of focal epilepsy with secondary generalization presenting to establish adult epilepsy care. Records from her pediatric neurologist Dr. Sharene Skeans were reviewed, her last visit with him was 01/2021. 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 Seizures began at age 23 with tingling in the right arm lasting 5-10 seconds followed by tightening of the right fist, flexion of the wrist and extension of the arm, head turning to the right, neck extending and eyes deviated to the right. 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 At that time, her mother denied any loss of consciousness, she would be able to talk during the seizures. EEG showed central spikes. They recall taking Keppra at that time, then oxcarbazepine. Her mother reports that she was seizure-free for 2.5 years until she had a full body shaking episode  at age 23 that was different from prior seizures. She was having frequent seizures until she was admitted to [facility] in 2013 and diagnosed with psychogenic non-epileptic events, she had multiple episodes of arrhythmic side to side shaking with pelvic thrusting with no EEG correlate, baseline EEG was normal. She was evaluated by neurologist Dr. Malvin Johns in 2015 for recurrent shaking episodes in school. EEG in 2015 was normal. She was started on Lamotrigine and referred to [facility]. She did not feel right on the Lamotrigine and went back to Dr. Sharene Skeans. Oxcarbazepine was restarted and they report that every time she would be weaned off oxcarbazepine, she would have a recurrent event. She states that she would have an aura of right arm numbness travelling up, she would feel shaking inside and see her body shaking briefly before she loses consciousness. No tongue bite or incontinence, no injuries. Her right side is always weaker after and she feels "super tired." Her mother reports they last less than a minute but cannot walk for 5 minutes later. Her last seizure was in 07/2019 when her parents were separating. This is her current longest seizure-free period (2 years). She denies any side effects on oxcarbazepine 600mg  2 tabs BID. She denies any olfactory/gustatory hallucinations, rising epigastric sensation, headaches, dizziness, diplopia, dysarthria/dysphagia, neck/back pain, bowel/bladder dysfunction. 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 Memory is okay. She is driving. She is on Depo-Provera for birth control. Sleep overall okay with 7 hours of sleep. Mood is good.   Epilepsy Risk Factors:  She had a normal birth and early development.  There is no history of febrile convulsions, CNS infections  such as meningitis/encephalitis, significant traumatic brain injury, neurosurgical procedures, or family history of seizures.  Prior AEDs:Lamotrigine (did not feel right), Keppra,  clonazepam  Prior EEGs: 2013 EMU normal baseline EEG with several episodes of arrhythmic side to side shaking, pelvic thrusting with no EEG changes 2015 EEG at [facility] normal.  MRI brain in 08/2007 at [facility] was normal.   PAST MEDICAL HISTORY: Past Medical History:  Diagnosis Date   Seizures (HCC)     MEDICATIONS: Current Outpatient Medications on File Prior to Visit  Medication Sig Dispense Refill   oxcarbazepine (TRILEPTAL) 600 MG tablet TAKE 2 TABLETS BY MOUTH  TWICE DAILY 360 tablet 3   No current facility-administered medications on file prior to visit.    ALLERGIES: Allergies  Allergen Reactions   Mucinex [Guaifenesin Er]     FAMILY HISTORY: No family history on file.  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   She attended [employer].   She lives with both parents. She has two sisters.   She enjoys music, Softball, and being in her room.   She is a Holiday representative at [employer].   Right handed   Social Drivers of Corporate investment banker Strain: Not on file  Food Insecurity: Not on file  Transportation Needs: Not on file  Physical Activity: Not on file  Stress: Not on file  Social Connections: Not on file  Intimate Partner Violence: Not on file     PHYSICAL EXAM:  Today's Vitals   10/29/23 1416  BP: 113/72  Pulse: 88  SpO2: 97%  Weight: 207 lb 6.4 oz (94.1 kg)  Height: 5\' 1"  (1.549 m)    Body mass index is 39.19 kg/m.  General: No acute distress Head:  Normocephalic/atraumatic Skin/Extremities: No rash, no edema Neurological Exam: alert and awake. No aphasia or dysarthria. Fund of knowledge is appropriate.  Attention and concentration are normal.   Cranial nerves: Pupils equal, round. Extraocular movements intact with no nystagmus. Visual fields full.  No facial asymmetry.  Motor: Bulk and tone normal, muscle strength 5/5 throughout with no pronator drift.   Finger to nose testing intact.  Gait narrow-based and steady, able to tandem walk adequately.  Romberg negative.   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 Focal seizures started at age 38 with right-sided numbness and clonic activity, she had an increase in seizures in 2013 and EMU monitoring at [facility] had captured shaking episodes with no EEG correlate consistent with psychogenic non-epileptic events. MRI brain in 2009 normal. She has been doing well seizure-free since 07/2019 on Oxcarbazepine 1200mg  BID, refills sent. We discussed avoiding seizure triggers, including missing medication, sleep deprivation. She is reporting poor sleep, snoring, and daytime drowsiness. We discussed doing a home  sleep study to assess for OSA. No pregnancy plans. She is aware of [place] driving laws to stop driving after a seizure until 6 months seizure-free. Follow-up in 1 year, call for any changes.   Thank you for allowing me to participate in her care.  Please do not hesitate to call for any questions or concerns.    Patrcia Dolly, M.D.   CC: Dr. Della Goo Nogo

## 2023-10-29 NOTE — Patient Instructions (Signed)
 Always good to see you.   Continue Oxcarbazepine 600mg : take 2 tablets twice a day  2. Schedule home sleep study  3. Follow-up in 1 year, call for any changes   Seizure Precautions: 1. If medication has been prescribed for you to prevent seizures, take it exactly as directed.  Do not stop taking the medicine without talking to your doctor first, even if you have not had a seizure in a long time.   2. Avoid activities in which a seizure would cause danger to yourself or to others.  Don't operate dangerous machinery, swim alone, or climb in high or dangerous places, such as on ladders, roofs, or girders.  Do not drive unless your doctor says you may.  3. If you have any warning that you may have a seizure, lay down in a safe place where you can't hurt yourself.    4.  No driving for 6 months from last seizure, as per [facility].   Please refer to the following link on the Epile[employer] of [employer]'s website for more information: http://www.epilepsyfoundation.org/answerplace/Social/driving/drivingu.cfm   5.  Maintain good sleep hygiene. Avoid alcohol.  6.  Notify your neurology if you are planning pregnancy or if you become pregnant.  7.  Contact your doctor if you have any problems that may be related to the medicine you are taking.  8.  Call 911 and bring the patient back to the ED if:        A.  The seizure lasts longer than 5 minutes.       B.  The patient doesn't awaken shortly after the seizure  C.  The patient has new problems such as difficulty seeing, speaking or moving  D.  The patient was injured during the seizure  E.  The patient has a temperature over 102 F (39C)  F.  The patient vomited and now is having trouble breathing

## 2024-06-23 ENCOUNTER — Encounter: Payer: Self-pay | Admitting: Neurology

## 2024-10-20 ENCOUNTER — Ambulatory Visit: Admitting: Neurology
